# Patient Record
Sex: Male | Born: 1990 | Race: White | Hispanic: No | Marital: Single | Smoking: Never smoker
Health system: Southern US, Community
[De-identification: ages and names within clinical notes are randomized; demographics above are authoritative.]

## PROBLEM LIST (undated history)

## (undated) DIAGNOSIS — Z8679 Personal history of other diseases of the circulatory system: Secondary | ICD-10-CM

## (undated) DIAGNOSIS — R002 Palpitations: Secondary | ICD-10-CM

## (undated) DIAGNOSIS — G43909 Migraine, unspecified, not intractable, without status migrainosus: Secondary | ICD-10-CM

## (undated) HISTORY — DX: Palpitations: R00.2

## (undated) HISTORY — DX: Migraine, unspecified, not intractable, without status migrainosus: G43.909

## (undated) HISTORY — DX: Personal history of other diseases of the circulatory system: Z86.79

---
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
## 2012-09-04 ENCOUNTER — Telehealth: Payer: Self-pay | Admitting: Cardiovascular Disease

## 2012-09-09 ENCOUNTER — Encounter: Payer: Self-pay | Admitting: Cardiovascular Disease

## 2012-09-09 ENCOUNTER — Ambulatory Visit (INDEPENDENT_AMBULATORY_CARE_PROVIDER_SITE_OTHER): Payer: BC Managed Care – PPO | Admitting: Cardiovascular Disease

## 2012-09-09 VITALS — BP 126/84 | HR 72 | Ht 72.0 in | Wt 175.0 lb

## 2012-09-09 DIAGNOSIS — R002 Palpitations: Secondary | ICD-10-CM

## 2012-09-09 DIAGNOSIS — R079 Chest pain, unspecified: Secondary | ICD-10-CM

## 2012-09-09 DIAGNOSIS — Z8679 Personal history of other diseases of the circulatory system: Secondary | ICD-10-CM

## 2012-09-09 DIAGNOSIS — R011 Cardiac murmur, unspecified: Secondary | ICD-10-CM

## 2012-09-09 NOTE — Assessment & Plan Note (Signed)
No murmur auscultated today but will get a 2-D echo to further evaluate

## 2012-09-09 NOTE — Patient Instructions (Signed)
  Your physician wants you to follow-up with him after testing.                                                     Your physician has ordered the following tests: 2 week cardionet monitor, echocardiogram

## 2012-09-09 NOTE — Progress Notes (Signed)
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 He is premed. He was referred by Dr. Phillips Odor for evaluation of tachycardia palpitations and a history of a murmur. 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 He has no cardiac risk factors nor is there a family history. His palpitations to 3 times a week for no apparent reason that self terminated. He denies stimulants caffeine, alcohol or illicit drug usage.   Current Outpatient Prescriptions  Medication Sig Dispense Refill  . cetirizine (ZYRTEC) 10 MG tablet Take 10 mg by mouth daily.      . SUMAtriptan (IMITREX) 100 MG tablet Take 100 mg by mouth every 2 (two) hours as needed for migraine.       No current facility-administered medications for this visit.    No Known Allergies  History   Social History  . Marital Status: Single    Spouse Name: N/A    Number of Children: N/A  . Years of Education: N/A   Occupational History  . Not on file.   Social History Main Topics  . Smoking status: Never Smoker   . Smokeless tobacco: Not on file  . Alcohol Use: Not on file  . Drug Use: Not on file  . Sexually Active: Not on file   Other Topics Concern  . Not on file   Social History Narrative  . No narrative on file     Review of Systems: General: negative for chills, fever, night sweats or weight changes.  Cardiovascular: negative for chest pain, dyspnea on exertion, edema, orthopnea, palpitations, paroxysmal nocturnal dyspnea or shortness of breath Dermatological: negative for rash Respiratory: negative for cough or wheezing Urologic: negative for hematuria Abdominal: negative for nausea, vomiting, diarrhea, bright red blood per rectum, melena,  or hematemesis Neurologic: negative for visual changes, syncope, or dizziness All other systems reviewed and are otherwise negative except as noted above.    Blood pressure 126/84, pulse 72, height 6' (1.829 m), weight 175 lb (79.379 kg).  General appearance: alert and no distress Neck: no adenopathy, no carotid bruit, no JVD, supple, symmetrical, trachea midline and thyroid not enlarged, symmetric, no tenderness/mass/nodules Lungs: clear to auscultation bilaterally Heart: regular rate and rhythm, S1, S2 normal, no murmur, click, rub or gallop Abdomen: soft, non-tender; bowel sounds normal; no masses,  no organomegaly Extremities: extremities normal, atraumatic, no cyanosis or edema Pulses: 2+ and symmetric  EKG normal sinus rhythm at 72 without ST or T wave changes  ASSESSMENT AND PLAN:   Palpitations Occurs several times a week without provocation lasting minutes at a time and spontaneously resolve. We'll obtain a 2 week event monitor to further evaluate  History of cardiac murmur No murmur auscultated today but will get a 2-D echo to further evaluate       Runell Gess MD [facility], [facility] 09/09/2012 3:13 PM

## 2012-09-09 NOTE — Assessment & Plan Note (Signed)
Occurs several times a week without provocation lasting minutes at a time and spontaneously resolve. We'll obtain a 2 week event monitor to further evaluate

## 2012-09-22 ENCOUNTER — Ambulatory Visit (HOSPITAL_COMMUNITY)
Admission: RE | Admit: 2012-09-22 | Discharge: 2012-09-22 | Disposition: A | Discharge status: Home / Self Care | Payer: BC Managed Care – PPO | Source: Ambulatory Visit | Attending: Cardiovascular Disease | Admitting: Cardiovascular Disease

## 2012-09-22 DIAGNOSIS — I359 Nonrheumatic aortic valve disorder, unspecified: Secondary | ICD-10-CM | POA: Insufficient documentation

## 2012-09-22 DIAGNOSIS — R079 Chest pain, unspecified: Secondary | ICD-10-CM | POA: Insufficient documentation

## 2012-09-22 DIAGNOSIS — R002 Palpitations: Secondary | ICD-10-CM | POA: Insufficient documentation

## 2012-09-22 DIAGNOSIS — R011 Cardiac murmur, unspecified: Secondary | ICD-10-CM

## 2012-09-22 DIAGNOSIS — I079 Rheumatic tricuspid valve disease, unspecified: Secondary | ICD-10-CM | POA: Insufficient documentation

## 2012-09-22 NOTE — Progress Notes (Signed)
2D Echo Performed 09/22/2012    Atlee Kluth, RCS  

## 2012-10-21 ENCOUNTER — Encounter: Payer: Self-pay | Admitting: Cardiovascular Disease

## 2012-10-22 ENCOUNTER — Ambulatory Visit (INDEPENDENT_AMBULATORY_CARE_PROVIDER_SITE_OTHER): Payer: BC Managed Care – PPO | Admitting: Cardiovascular Disease

## 2012-10-22 ENCOUNTER — Encounter: Payer: Self-pay | Admitting: Cardiovascular Disease

## 2012-10-22 VITALS — BP 120/76 | HR 72 | Ht 72.0 in | Wt 174.3 lb

## 2012-10-22 DIAGNOSIS — I471 Supraventricular tachycardia, unspecified: Secondary | ICD-10-CM

## 2012-10-22 NOTE — Assessment & Plan Note (Signed)
A cardiac monitor was obtained which showed episodes of inappropriate sinus tachycardia at rates up to 170 with minimal exercise. During his episodes he felt lightheaded. 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 As he has also episodes of bradycardia to be difficult to treat him with a beta blocker.

## 2012-10-22 NOTE — Progress Notes (Signed)
   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 He is premed. He was referred by Dr. Phillips Odor for evaluation of tachycardia palpitations and a history of a murmur. 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 He has no cardiac risk factors nor is there a family history. His palpitations to 3 times a week for no apparent reason that self terminated. He denies stimulants caffeine, alcohol or illicit drug usage. A 2-D echo was performed this showed mild concentric LVH with mild AI. An event monitor showed episodes of inappropriate sinus tachycardia.    Current Outpatient Prescriptions  Medication Sig Dispense Refill  . cetirizine (ZYRTEC) 10 MG tablet Take 10 mg by mouth daily.      . SUMAtriptan (IMITREX) 100 MG tablet Take 100 mg by mouth every 2 (two) hours as needed for migraine.       No current facility-administered medications for this visit.    No Known Allergies  History   Social History  . Marital Status: Single    Spouse Name: N/A    Number of Children: N/A  . Years of Education: N/A   Occupational History  . Not on file.   Social History Main Topics  . Smoking status: Never Smoker   . Smokeless tobacco: Never Used  . Alcohol Use: Yes     Comment: Socially  . Drug Use: No  . Sexually Active: Not on file   Other Topics Concern  . Not on file   Social History Narrative  . No narrative on file     Review of Systems: General: negative for chills, fever, night sweats or weight changes.  Cardiovascular: negative for chest pain, dyspnea on exertion, edema, orthopnea, palpitations, paroxysmal nocturnal dyspnea or shortness of breath Dermatological: negative for rash Respiratory:  negative for cough or wheezing Urologic: negative for hematuria Abdominal: negative for nausea, vomiting, diarrhea, bright red blood per rectum, melena, or hematemesis Neurologic: negative for visual changes, syncope, or dizziness All other systems reviewed and are otherwise negative except as noted above.    Blood pressure 120/76, pulse 72, height 6' (1.829 m), weight 174 lb 4.8 oz (79.062 kg).  General appearance: alert and no distress Neck: no adenopathy, no carotid bruit, no JVD, supple, symmetrical, trachea midline and thyroid not enlarged, symmetric, no tenderness/mass/nodules Lungs: clear to auscultation bilaterally Heart: regular rate and rhythm, S1, S2 normal, no murmur, click, rub or gallop Extremities: extremities normal, atraumatic, no cyanosis or edema  EKG not performed today  ASSESSMENT AND PLAN:   Palpitations A cardiac monitor was obtained which showed episodes of inappropriate sinus tachycardia at rates up to 170 with minimal exercise. During his episodes he felt lightheaded. 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 As he has also episodes of bradycardia to be difficult to treat him with a beta blocker.  History of cardiac murmur A 2-D echocardiogram was performed that showed normal LV systolic function, mild concentric left hypertrophy with mild AI and anteriorly to directed jet. There is no MR      Runell Gess MD FACP,FACC,FAHA, [facility] 10/22/2012 4:15 PM

## 2012-10-22 NOTE — Assessment & Plan Note (Signed)
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 There is no MR

## 2012-10-22 NOTE — Patient Instructions (Addendum)
You have been referred to An Electro Physiologyt for Tachycardia at [facility]

## 2012-11-25 ENCOUNTER — Institutional Professional Consult (permissible substitution): Payer: BC Managed Care – PPO | Admitting: Internal Medicine

## 2013-02-05 ENCOUNTER — Encounter: Payer: Self-pay | Admitting: *Deleted

## 2013-02-19 ENCOUNTER — Institutional Professional Consult (permissible substitution): Payer: BC Managed Care – PPO | Admitting: Internal Medicine

## 2013-02-23 ENCOUNTER — Encounter: Payer: Self-pay | Admitting: Internal Medicine

## 2013-08-05 NOTE — Telephone Encounter (Signed)
Closed encounter °
